# Patient Record
Sex: Male | Born: 1980 | Race: Black or African American | Hispanic: No | Marital: Single | Smoking: Never smoker
Health system: Southern US, Community
[De-identification: ages and names within clinical notes are randomized; demographics above are authoritative.]

---

## 2018-05-03 ENCOUNTER — Encounter (HOSPITAL_COMMUNITY): Payer: Self-pay | Admitting: Emergency Medicine

## 2018-05-03 ENCOUNTER — Emergency Department (HOSPITAL_COMMUNITY): Payer: No Typology Code available for payment source

## 2018-05-03 ENCOUNTER — Emergency Department (HOSPITAL_COMMUNITY)
Admission: EM | Admit: 2018-05-03 | Discharge: 2018-05-03 | Disposition: A | Discharge status: Home / Self Care | Payer: No Typology Code available for payment source | Attending: Emergency Medicine | Admitting: Emergency Medicine

## 2018-05-03 ENCOUNTER — Other Ambulatory Visit: Payer: Self-pay

## 2018-05-03 DIAGNOSIS — F10929 Alcohol use, unspecified with intoxication, unspecified: Secondary | ICD-10-CM | POA: Insufficient documentation

## 2018-05-03 DIAGNOSIS — S0990XA Unspecified injury of head, initial encounter: Secondary | ICD-10-CM | POA: Diagnosis present

## 2018-05-03 DIAGNOSIS — Y9389 Activity, other specified: Secondary | ICD-10-CM | POA: Diagnosis not present

## 2018-05-03 DIAGNOSIS — S3991XA Unspecified injury of abdomen, initial encounter: Secondary | ICD-10-CM | POA: Insufficient documentation

## 2018-05-03 DIAGNOSIS — S22080A Wedge compression fracture of T11-T12 vertebra, initial encounter for closed fracture: Secondary | ICD-10-CM

## 2018-05-03 DIAGNOSIS — Y92411 Interstate highway as the place of occurrence of the external cause: Secondary | ICD-10-CM | POA: Diagnosis not present

## 2018-05-03 DIAGNOSIS — Y999 Unspecified external cause status: Secondary | ICD-10-CM | POA: Insufficient documentation

## 2018-05-03 LAB — RAPID URINE DRUG SCREEN, HOSP PERFORMED
Amphetamines: NOT DETECTED
BENZODIAZEPINES: POSITIVE — AB
Cocaine: POSITIVE — AB
Opiates: NOT DETECTED
Tetrahydrocannabinol: POSITIVE — AB

## 2018-05-03 LAB — COMPREHENSIVE METABOLIC PANEL
ALT: 28 U/L (ref 0–44)
ANION GAP: 10 (ref 5–15)
AST: 34 U/L (ref 15–41)
Albumin: 4.1 g/dL (ref 3.5–5.0)
Alkaline Phosphatase: 50 U/L (ref 38–126)
BUN: 15 mg/dL (ref 6–20)
CHLORIDE: 110 mmol/L (ref 98–111)
CO2: 24 mmol/L (ref 22–32)
Calcium: 8.9 mg/dL (ref 8.9–10.3)
Creatinine, Ser: 1.45 mg/dL — ABNORMAL HIGH (ref 0.61–1.24)
GFR calc Af Amer: >60 mL/min (ref >60)
GFR calc non Af Amer: >60 mL/min (ref >60)
GLUCOSE: 88 mg/dL (ref 70–99)
POTASSIUM: 3.8 mmol/L (ref 3.5–5.1)
Sodium: 144 mmol/L (ref 135–145)
TOTAL PROTEIN: 6.7 g/dL (ref 6.5–8.1)
Total Bilirubin: 0.7 mg/dL (ref 0.3–1.2)

## 2018-05-03 LAB — CBC WITH DIFFERENTIAL/PLATELET
ABS IMMATURE GRANULOCYTES: 0.1 10*3/uL (ref 0.0–0.1)
BASOS ABS: 0 10*3/uL (ref 0.0–0.1)
BASOS PCT: 1 %
Eosinophils Absolute: 0 10*3/uL (ref 0.0–0.7)
Eosinophils Relative: 1 %
HCT: 42.1 % (ref 39.0–52.0)
Hemoglobin: 13.9 g/dL (ref 13.0–17.0)
IMMATURE GRANULOCYTES: 2 %
Lymphocytes Relative: 24 %
Lymphs Abs: 1.9 10*3/uL (ref 0.7–4.0)
MCH: 30.5 pg (ref 26.0–34.0)
MCHC: 33 g/dL (ref 30.0–36.0)
MCV: 92.3 fL (ref 78.0–100.0)
MONO ABS: 0.7 10*3/uL (ref 0.1–1.0)
Monocytes Relative: 8 %
NEUTROS ABS: 5.2 10*3/uL (ref 1.7–7.7)
Neutrophils Relative %: 64 %
PLATELETS: 250 10*3/uL (ref 150–400)
RBC: 4.56 MIL/uL (ref 4.22–5.81)
RDW: 12.1 % (ref 11.5–15.5)
WBC: 8.1 10*3/uL (ref 4.0–10.5)

## 2018-05-03 LAB — ETHANOL: ALCOHOL ETHYL (B): 199 mg/dL — AB (ref <10)

## 2018-05-03 MED ORDER — MELOXICAM 15 MG PO TABS: 15.0000 mg | ORAL_TABLET | Freq: Every day | ORAL | 0 refills | Status: AC

## 2018-05-03 MED ORDER — CYCLOBENZAPRINE HCL 10 MG PO TABS: 5.0000 mg | ORAL_TABLET | Freq: Two times a day (BID) | ORAL | 0 refills | Status: AC | PRN

## 2018-05-03 MED ORDER — ONDANSETRON HCL 4 MG/2ML IJ SOLN
4.0000 mg | INTRAMUSCULAR | Status: AC
Start: 2018-05-03 — End: 2018-05-03
  Administered 2018-05-03: 4 mg via INTRAVENOUS
  Filled 2018-05-03: qty 2

## 2018-05-03 MED ORDER — OXYCODONE-ACETAMINOPHEN 5-325 MG PO TABS: 1.0000 | ORAL_TABLET | ORAL | 0 refills | Status: AC | PRN

## 2018-05-03 MED ORDER — OXYCODONE-ACETAMINOPHEN 5-325 MG PO TABS
2.0000 | ORAL_TABLET | Freq: Once | ORAL | Status: AC
  Administered 2018-05-03: 2 via ORAL
  Filled 2018-05-03: qty 2

## 2018-05-03 MED ORDER — IOPAMIDOL (ISOVUE-300) INJECTION 61%
100.0000 mL | Freq: Once | INTRAVENOUS | Status: AC | PRN
  Administered 2018-05-03: 100 mL via INTRAVENOUS

## 2018-05-03 MED ORDER — DOCUSATE SODIUM 250 MG PO CAPS: 250.0000 mg | ORAL_CAPSULE | Freq: Every day | ORAL | 0 refills | Status: AC

## 2018-05-03 MED ORDER — HYDROMORPHONE HCL 1 MG/ML IJ SOLN
0.5000 mg | Freq: Once | INTRAMUSCULAR | Status: AC
  Administered 2018-05-03: 0.5 mg via INTRAVENOUS
  Filled 2018-05-03: qty 1
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
## 2018-05-03 NOTE — Discharge Summary (Signed)
Pt to xray

## 2018-05-03 NOTE — Progress Notes (Signed)
Orthopedic Tech Progress Note Patient Details:  George CaroliJovan Ammon August 08, 1981 [id number]  Patient ID: George Harmon, male   DOB: August 08, 1981, 37 y.o.   MRN: [id number]   [name], [name] 05/03/2018, 9:21 AM Called in bio-tech brace order; spoke with Wylene MenLacey

## 2018-05-03 NOTE — ED Notes (Signed)
PA at bedside.

## 2018-05-03 NOTE — ED Notes (Signed)
Pt states he is in to much pain to sit up, advised family and patient we will wait for pain medication to kick in a little and then will get pt into wheelchair and into car.

## 2018-05-03 NOTE — ED Notes (Signed)
0.5 mg dilaudid wasted in sharps with RN Durenda HurtHannah Steel

## 2018-05-03 NOTE — Discharge Instructions (Signed)
Return to the emergency department immediately if you develop any of the following symptoms: You have numbness, tingling, or weakness in the arms or legs. You develop severe headaches not relieved with medicine. You have severe neck pain, especially tenderness in the middle of the back of your neck. You have changes in bowel or bladder control. There is increasing pain in any area of the body. You have shortness of breath, light-headedness, dizziness, or fainting. You have chest pain. You feel sick to your stomach (nauseous), throw up (vomit), or sweat. You have increasing abdominal discomfort. There is blood in your urine, stool, or vomit. You have pain in your shoulder (shoulder strap areas). You feel your symptoms are getting worse. Contact a health care provider if: You have a fever. You develop a cough that makes your pain worse. Your pain medicine is not helping. Your pain does not get better over time. You cannot return to your normal activities as planned or expected. Get help right away if: Your pain is very bad and it suddenly gets worse. You are unable to move any body part (paralysis) that is below the level of your injury. You have numbness, tingling, or weakness in any body part that is below the level of your injury. You cannot control your bladder or bowels.

## 2018-05-03 NOTE — ED Notes (Signed)
Ortho tech paged for need of TLSO brace. States will notify biomed.
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
